# Patient Record
Sex: Male | Born: 1995 | Race: Black or African American | Hispanic: No | Marital: Single | Smoking: Never smoker
Health system: Southern US, Community
[De-identification: ages and names within clinical notes are randomized; demographics above are authoritative.]

## PROBLEM LIST (undated history)

## (undated) SURGERY — Surgical Case
Anesthesia: *Unknown

---

## 2019-04-03 ENCOUNTER — Other Ambulatory Visit: Payer: Self-pay

## 2019-04-03 DIAGNOSIS — Z20822 Contact with and (suspected) exposure to covid-19: Secondary | ICD-10-CM

## 2019-04-03 NOTE — Addendum Note (Signed)
Addended by: Luisa Dago A on: 04/03/2019 12:40 PM   Modules accepted: Orders

## 2019-04-04 LAB — NOVEL CORONAVIRUS, NAA: SARS-CoV-2, NAA: NOT DETECTED

## 2019-04-16 NOTE — Addendum Note (Signed)
Addended by: Sentoria Brent M on: 04/16/2019 12:35 PM   Modules accepted: Orders  

## 2019-06-17 ENCOUNTER — Other Ambulatory Visit: Payer: Self-pay | Admitting: *Deleted

## 2019-06-17 DIAGNOSIS — U071 COVID-19: Secondary | ICD-10-CM

## 2019-06-23 ENCOUNTER — Other Ambulatory Visit: Payer: Self-pay

## 2019-06-23 ENCOUNTER — Other Ambulatory Visit (HOSPITAL_COMMUNITY): Payer: Self-pay

## 2019-06-30 ENCOUNTER — Other Ambulatory Visit: Payer: Self-pay | Admitting: Orthopedic Surgery

## 2019-06-30 DIAGNOSIS — S42144A Nondisplaced fracture of glenoid cavity of scapula, right shoulder, initial encounter for closed fracture: Secondary | ICD-10-CM

## 2019-06-30 DIAGNOSIS — M25511 Pain in right shoulder: Secondary | ICD-10-CM

## 2019-07-01 ENCOUNTER — Ambulatory Visit (HOSPITAL_COMMUNITY): Payer: BC Managed Care – PPO | Attending: Cardiology

## 2019-07-01 ENCOUNTER — Other Ambulatory Visit: Payer: Self-pay

## 2019-07-01 ENCOUNTER — Other Ambulatory Visit: Payer: BC Managed Care – PPO | Admitting: *Deleted

## 2019-07-01 DIAGNOSIS — U071 COVID-19: Secondary | ICD-10-CM

## 2019-07-01 LAB — TROPONIN I (HIGH SENSITIVITY): Troponin I (High Sensitivity): 3 ng/L (ref <18)

## 2019-07-12 ENCOUNTER — Ambulatory Visit
Admission: RE | Admit: 2019-07-12 | Discharge: 2019-07-12 | Disposition: A | Discharge status: Home / Self Care | Payer: BC Managed Care – PPO | Source: Ambulatory Visit | Attending: Orthopedic Surgery | Admitting: Orthopedic Surgery

## 2019-07-12 DIAGNOSIS — M25511 Pain in right shoulder: Secondary | ICD-10-CM

## 2019-07-12 DIAGNOSIS — S42144A Nondisplaced fracture of glenoid cavity of scapula, right shoulder, initial encounter for closed fracture: Secondary | ICD-10-CM

## 2020-01-25 ENCOUNTER — Ambulatory Visit: Payer: No Typology Code available for payment source | Attending: Family

## 2020-01-25 DIAGNOSIS — Z23 Encounter for immunization: Secondary | ICD-10-CM

## 2020-01-25 NOTE — Progress Notes (Signed)
   Covid-19 Vaccination Clinic  Name:  Charles Zavala    MRN: [id number] DOB: [birth year]  01/25/2020  [name] was observed post Covid-19 immunization for 15 minutes without incident. He was provided with Vaccine Information Sheet and instruction to access the V-Safe system.   [name] was instructed to call 911 with any severe reactions post vaccine: Marland Kitchen Difficulty breathing  . Swelling of face and throat  . A fast heartbeat  . A bad rash all over body  . Dizziness and weakness   Immunizations Administered    Name Date Dose VIS Date Route   Moderna COVID-19 Vaccine 01/25/2020 12:05 PM 0.5 mL 07/2019 Intramuscular   Manufacturer: Gala Murdoch   Lot: [id number]   NDC: 09295-747-34

## 2020-02-22 ENCOUNTER — Ambulatory Visit: Payer: No Typology Code available for payment source | Attending: Family

## 2020-02-22 DIAGNOSIS — Z23 Encounter for immunization: Secondary | ICD-10-CM

## 2020-02-22 NOTE — Progress Notes (Signed)
   Covid-19 Vaccination Clinic  Name:  Charles Zavala    MRN: [id number] DOB: October 04, 1995  02/22/2020  Charles Zavala was observed post Covid-19 immunization for 15 minutes without incident. He was provided with Vaccine Information Sheet and instruction to access the V-Safe system.   Charles Zavala was instructed to call 911 with any severe reactions post vaccine: Marland Kitchen Difficulty breathing  . Swelling of face and throat  . A fast heartbeat  . A bad rash all over body  . Dizziness and weakness   Immunizations Administered    Name Date Dose VIS Date Route   Moderna COVID-19 Vaccine 02/22/2020 12:58 PM 0.5 mL 07/2019 Intramuscular   Manufacturer: Moderna   Lot: [id number]   NDC: 95188-416-60

## 2020-11-12 IMAGING — CT CT SHOULDER*R* W/O CM
3 series · 12 of 35 positions shown, 14 images · non-contrast
Comparison: Radiographs 06/04/2019. MR arthrogram 06/16/2019.

CLINICAL DATA: Persistent right shoulder pain. History of multiple
dislocations. Shoulder surgery in 1431.

EXAM:
CT OF THE UPPER RIGHT EXTREMITY WITHOUT CONTRAST
TECHNIQUE: Multidetector CT imaging of the right shoulder was performed
according to the standard protocol.

[Series 4: shoulder 2.00 br40 s3 axial soft · axial · 0.48mm/px · z∈[-975,-825]mm · 4 of 109 slices shown, 5 images]
[im 17/109  soft-tissue]
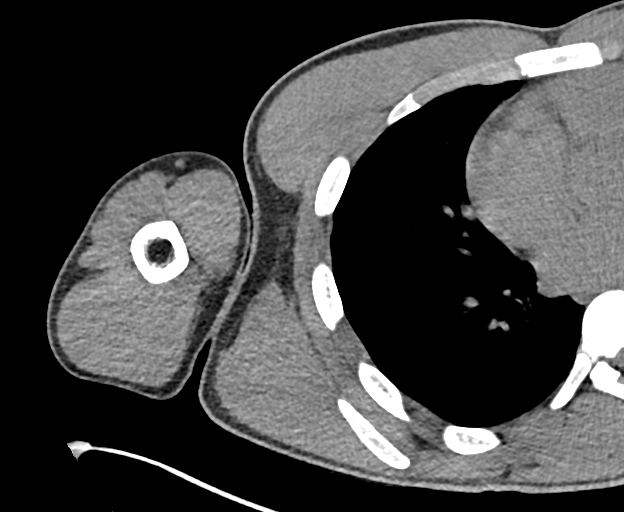
[im 17/109  bone]
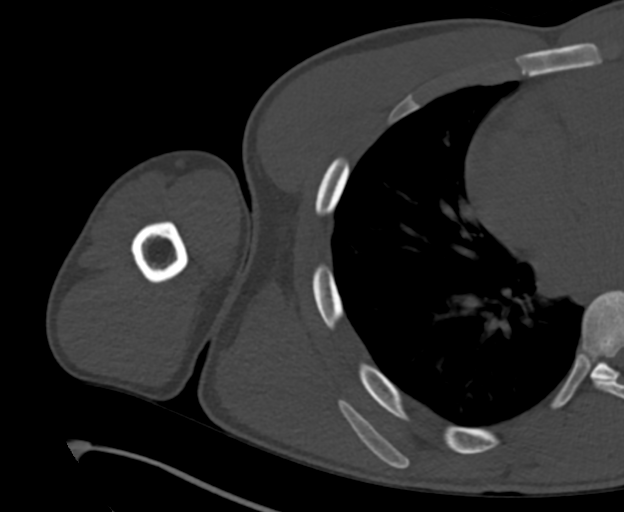
[im 42/109  bone]
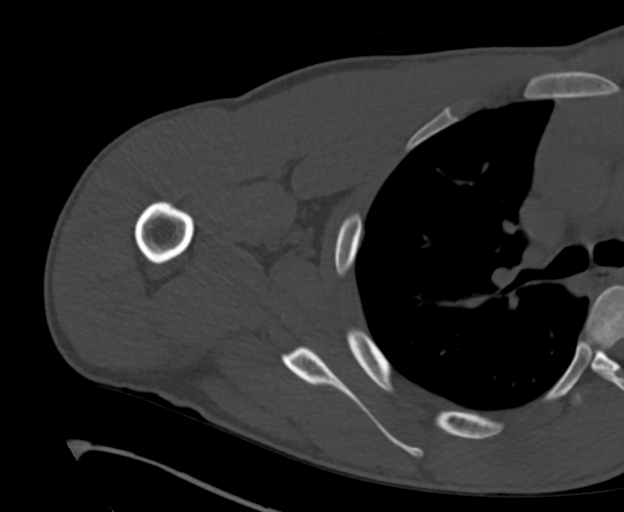
[im 67/109  bone]
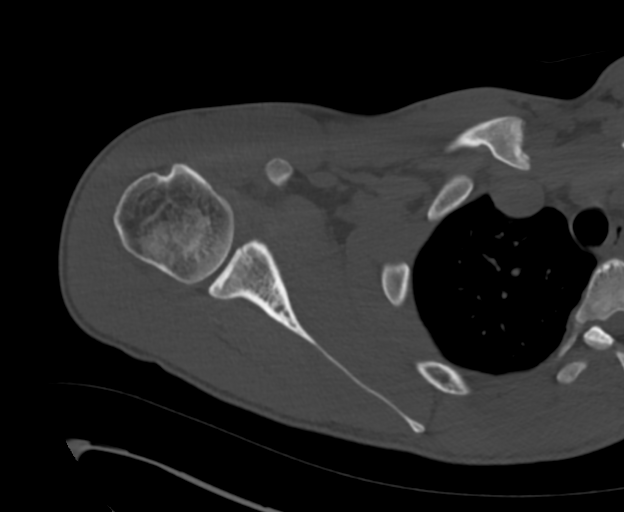
[im 92/109  bone]
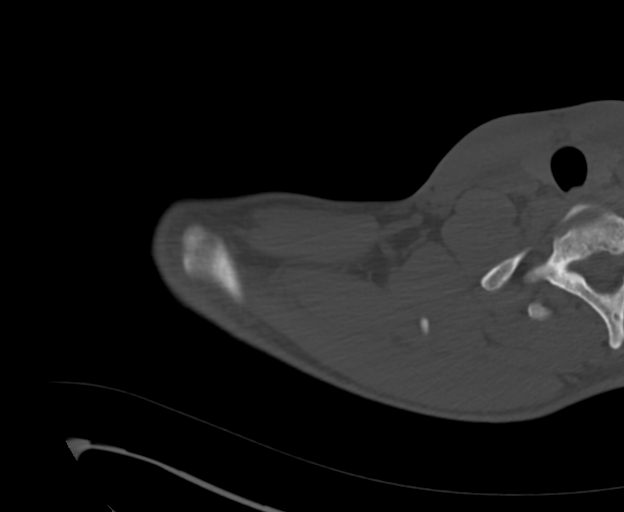

[Series 8: shoulder 2.00 br40 s3 cor soft · coronal · 0.43mm/px · 3 of 125 slices shown]
[im 25/125  bone]
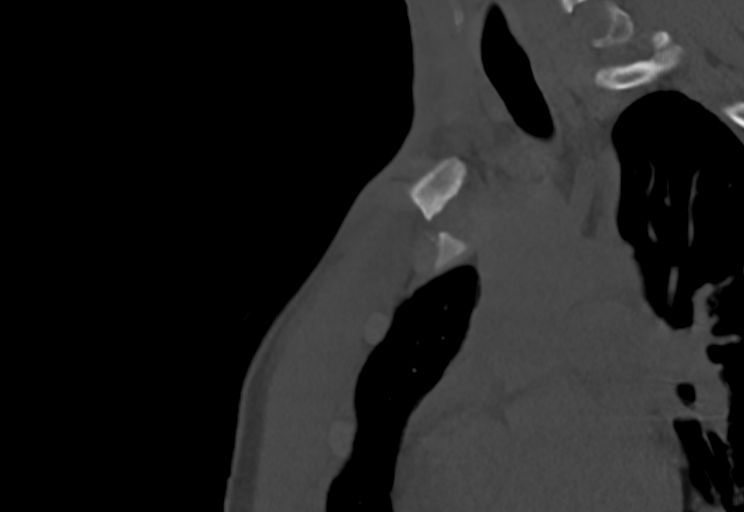
[im 50/125  bone]
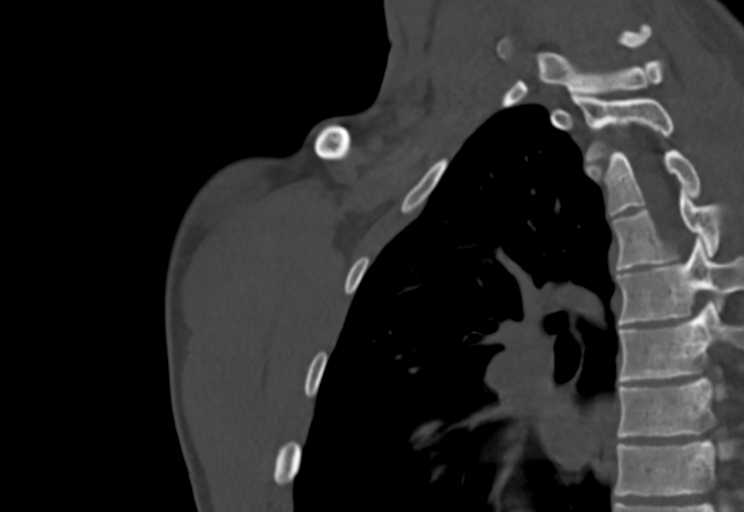
[im 75/125  bone]
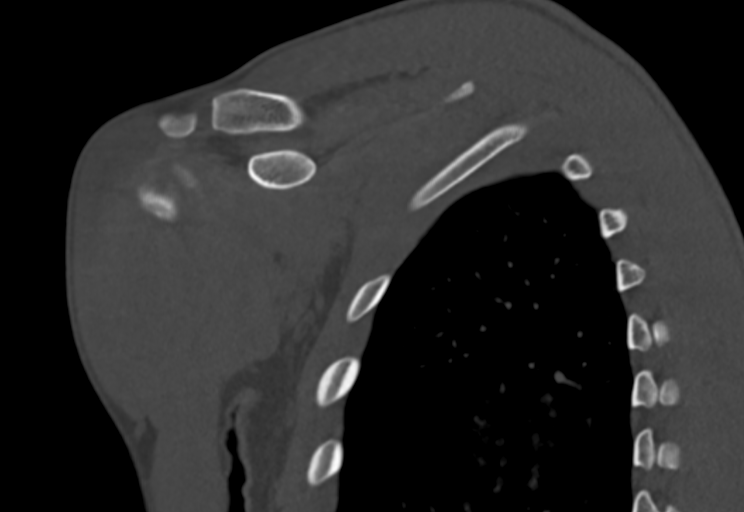

[Series 12: shoulder 2.00 br40 s3 sag soft · sagittal · 0.43mm/px · 5 of 143 slices shown, 6 images]
[im 48/143  bone]
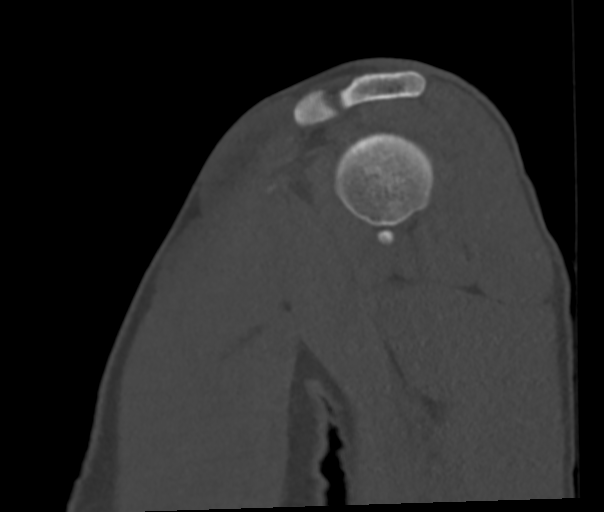
[im 60/143  bone]
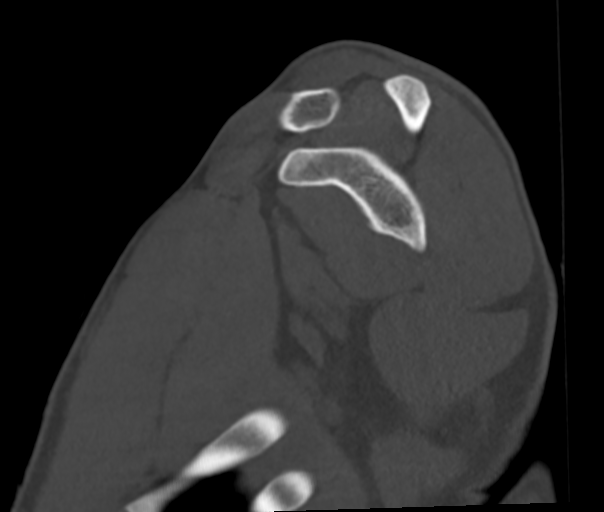
[im 72/143  soft-tissue]
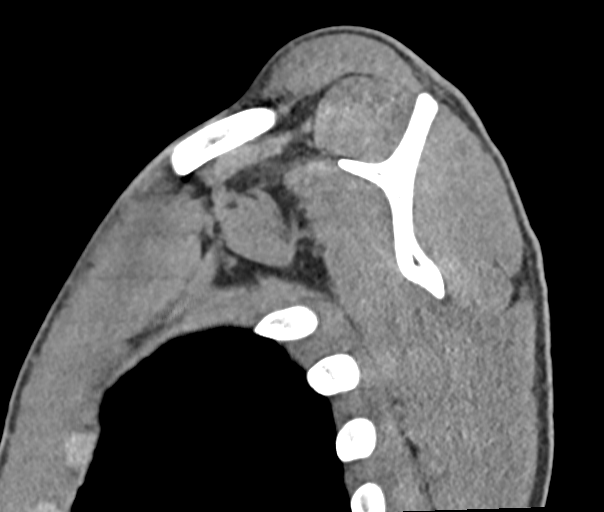
[im 72/143  bone]
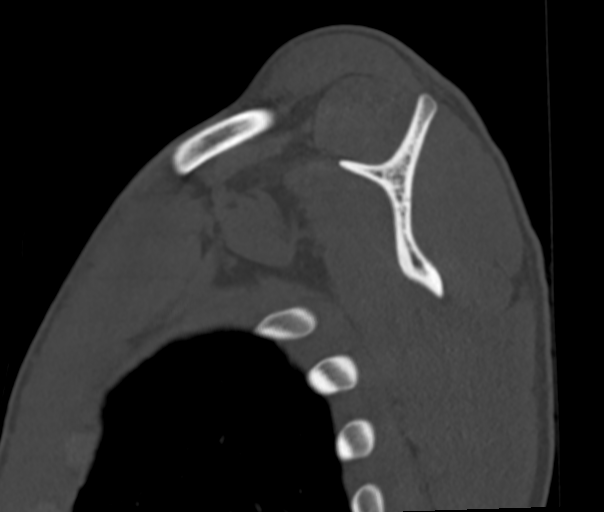
[im 83/143  bone]
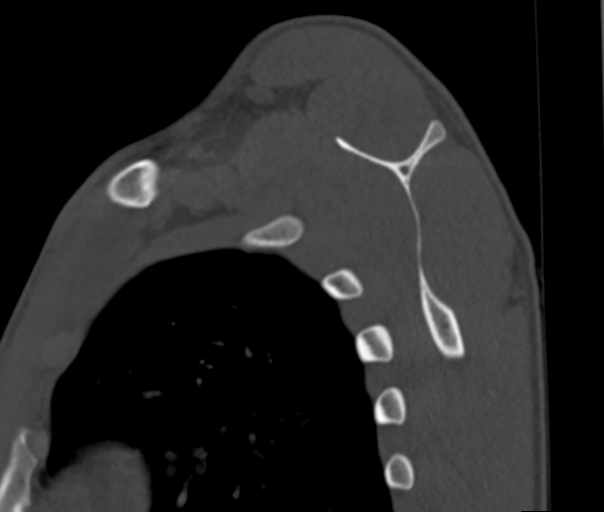
[im 95/143  bone]
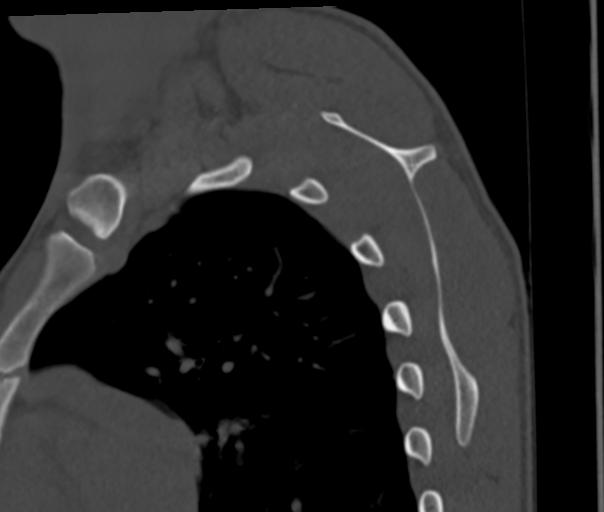

[12 of 35 positions shown; findings below may reference images not displayed]

FINDINGS: Bones/Joint/Cartilage

The humeral head is located. There is a chronic Hill-Sachs deformity
of the humeral head. Postsurgical changes are present within the
anterior glenoid consistent with previous labral repair. There is an
osseous Bankart lesion with an unfused linear bone fragment adjacent
to the anterior glenoid, measuring up to 15 mm on sagittal image
56/14. The anterior inferior glenoid rim is deficient.

No significant glenohumeral arthropathy. There is a small joint
effusion with a 6 mm intra-articular loose body in the axillary
recess as seen on MRI. The acromioclavicular joint appears normal.
No acute osseous findings.

Ligaments

Suboptimally assessed by CT.

Muscles and Tendons

No focal muscular atrophy.

Soft tissues

Unremarkable.
IMPRESSION: 1. Chronic Hill-Sachs deformity of the humeral head.
2. Chronic osseous Bankart lesion with unfused linear bone fragment
adjacent to the anterior glenoid. Deficient anterior inferior
glenoid rim.
3. Small joint effusion with intra-articular loose body in the
axillary recess.
4. No acute osseous findings.

## 2021-02-03 ENCOUNTER — Ambulatory Visit (HOSPITAL_COMMUNITY): Payer: BC Managed Care – PPO

## 2021-02-03 ENCOUNTER — Ambulatory Visit (HOSPITAL_COMMUNITY)
Admission: EM | Admit: 2021-02-03 | Discharge: 2021-02-03 | Disposition: A | Discharge status: Home / Self Care | Payer: BC Managed Care – PPO | Attending: Internal Medicine | Admitting: Internal Medicine

## 2021-02-03 ENCOUNTER — Ambulatory Visit (INDEPENDENT_AMBULATORY_CARE_PROVIDER_SITE_OTHER): Payer: BC Managed Care – PPO

## 2021-02-03 ENCOUNTER — Encounter (HOSPITAL_COMMUNITY): Payer: Self-pay

## 2021-02-03 DIAGNOSIS — S43015A Anterior dislocation of left humerus, initial encounter: Secondary | ICD-10-CM | POA: Diagnosis not present

## 2021-02-03 MED ORDER — TRAMADOL HCL 50 MG PO TABS: 50.0000 mg | ORAL_TABLET | Freq: Four times a day (QID) | ORAL | 0 refills | Status: AC | PRN

## 2021-02-03 MED ORDER — HYDROMORPHONE HCL 1 MG/ML IJ SOLN
2.0000 mg | Freq: Once | INTRAMUSCULAR | Status: AC
  Administered 2021-02-03: 2 mg via INTRAMUSCULAR

## 2021-02-03 MED ORDER — HYDROMORPHONE HCL 1 MG/ML IJ SOLN
INTRAMUSCULAR | Status: AC
  Filled 2021-02-03: qty 2

## 2021-02-03 NOTE — Discharge Instructions (Addendum)
-  Schedule a follow-up appointment with orthopedist, information below.  Call them to schedule this. -Keep your sling on is much as possible for the next few days, until your follow-up appointment. It's okay to remove this for bathing as long as you are able to keep that shoulder from moving. -Seek additional medical attention if you develop new symptoms like numbness in the arm, weakness in the hand or the elbow. -Starting tomorrow- Tramadol for pain, be careful because this can cause drowsiness.  Avoid taking before driving or operating machinery.  You can take ibuprofen and Tylenol for additional relief, take ibuprofen with food.

## 2021-02-03 NOTE — ED Triage Notes (Signed)
Pt in with c/o possible left shoulder dislocation that occurred today when he fell and landed on his arm  Denies any numbness/tingling in extremity

## 2021-02-03 NOTE — ED Provider Notes (Signed)
  [facility]   [id number] 02/03/21 Arrival Time: 1544  Procedure Note:  1. Anterior dislocation of left shoulder, initial encounter    Pt placed supine on exam table with LUE extending to floor. With gentle traction and scapular manipulation shoulder dislocation reduced. Normal ROM following. Normal radial pulse and distal sensation. Tolerated procedure well. No complications.  Post-reduction films show: Successful shoulder reduction with appropriate positioning of the glenohumeral joint. Small Hill-Sachs impaction deformity.    Mardella Layman, MD 02/05/21 [phone]

## 2021-02-03 NOTE — ED Provider Notes (Signed)
[facility]    CSN: [id number] Arrival date & time: 02/03/21  1544      History   Chief Complaint Chief Complaint  Patient presents with   Shoulder Injury    HPI Charles Zavala is a 25 y.o. male presenting with L shoulder pain following dislocation that occurred 3 hours ago.  History of 2 right shoulder dislocations with subsequent surgery, but no issues with the left shoulder in the past states he fell and caught himself on his outstretched L hand.  Denies sensation changes, numbness/tingling.  HPI  History reviewed. No pertinent past medical history.  There are no problems to display for this patient.   History reviewed. No pertinent surgical history.     Home Medications    Prior to Admission medications   Medication Sig Start Date End Date Taking? Authorizing Provider  traMADol (ULTRAM) 50 MG tablet Take 1 tablet (50 mg total) by mouth every 6 (six) hours as needed. 02/03/21  Yes Rhys Martini, PA-C    Family History History reviewed. No pertinent family history.  Social History Social History   Tobacco Use   Smoking status: Never   Smokeless tobacco: Never  Substance Use Topics   Alcohol use: Yes   Drug use: Never     Allergies   Patient has no known allergies.   Review of Systems Review of Systems  Musculoskeletal:        L shoulder pain  All other systems reviewed and are negative.   Physical Exam Triage Vital Signs ED Triage Vitals [02/03/21 1608]  Enc Vitals Group     BP (!) 112/95     Pulse Rate 68     Resp (!) 21     Temp 97.8 F (36.6 C)     Temp Source Oral     SpO2 99 %     Weight      Height      Head Circumference      Peak Flow      Pain Score 10     Pain Loc      Pain Edu?      Excl. in GC?    No data found.  Updated Vital Signs BP (!) 112/95 (BP Location: Right Arm)   Pulse 68   Temp 97.8 F (36.6 C) (Oral)   Resp (!) 21   SpO2 99%   Visual Acuity Right Eye Distance:   Left Eye Distance:    Bilateral Distance:    Right Eye Near:   Left Eye Near:    Bilateral Near:     Physical Exam Vitals reviewed.  Constitutional:      General: He is not in acute distress.    Appearance: Normal appearance. He is not ill-appearing or diaphoretic.  HENT:     Head: Normocephalic and atraumatic.  Cardiovascular:     Rate and Rhythm: Normal rate and regular rhythm.     Heart sounds: Normal heart sounds.  Pulmonary:     Effort: Pulmonary effort is normal.     Breath sounds: Normal breath sounds.  Musculoskeletal:     Comments: L shoulder- visibly anteriorly displaced. ROM and exam limited due to pain. No bony deformity. Sensation intact. Radial pulse 2+, cap refill <2 seconds, grip strength 5/5. No snuffbox tenderness.  Following reduction, sensation still intact, radial pulse 2+, cap refill less than 2 seconds.  Skin:    General: Skin is warm.  Neurological:     General: No focal deficit  present.     Mental Status: He is alert and oriented to person, place, and time.  Psychiatric:        Mood and Affect: Mood normal.        Behavior: Behavior normal.        Thought Content: Thought content normal.        Judgment: Judgment normal.     UC Treatments / Results  Labs (all labs ordered are listed, but only abnormal results are displayed) Labs Reviewed - No data to display  EKG   Radiology DG Shoulder Left  Result Date: 02/03/2021 CLINICAL DATA:  Recent injury with dislocation, initial encounter EXAM: LEFT SHOULDER - 2+ VIEW COMPARISON:  06/04/2019 FINDINGS: Anterior inferior dislocation of the humeral head is noted with respect to the glenoid. No definitive fracture is seen. IMPRESSION: Anterior inferior dislocation of the humeral head. Electronically Signed   By: Alcide Clever M.D.   On: 02/03/2021 16:34    Procedures Procedures (including critical care time)  Medications Ordered in UC Medications  HYDROmorphone (DILAUDID) injection 2 mg (2 mg Intramuscular Given 02/03/21  1658)    Initial Impression / Assessment and Plan / UC Course  I have reviewed the triage vital signs and the nursing notes.  Pertinent labs & imaging results that were available during my care of the patient were reviewed by me and considered in my medical decision making (see chart for details).    This patient is a 25 year old male presenting with left shoulder anterior dislocation that occurred 3 hours ago due to fall.  Neurovascularly intact.   Xray L shoulder- Anterior inferior dislocation of the humeral head.  Attending physician Dr. Tracie Harrier successfully performed reduction. Dilaudid for pain control. Patient neurovascularly intact following reduction.  Repeat Xray L shoulder- Successful shoulder reduction with appropriate positioning of the glenohumeral joint. Small Hill-Sachs impaction deformity.  Tramadol sent for pain control, follow-up with Ortho at their earliest convenience.  Keep shoulder immobilized with sling.   Final Clinical Impressions(s) / UC Diagnoses   Final diagnoses:  Anterior dislocation of left shoulder, initial encounter     Discharge Instructions      -Schedule a follow-up appointment with orthopedist, information below.  Call them to schedule this. -Keep your sling on is much as possible for the next few days, until your follow-up appointment. It's okay to remove this for bathing as long as you are able to keep that shoulder from moving. -Seek additional medical attention if you develop new symptoms like numbness in the arm, weakness in the hand or the elbow. -Starting tomorrow- Tramadol for pain, be careful because this can cause drowsiness.  Avoid taking before driving or operating machinery.  You can take ibuprofen and Tylenol for additional relief, take ibuprofen with food.     ED Prescriptions     Medication Sig Dispense Auth. Provider   traMADol (ULTRAM) 50 MG tablet Take 1 tablet (50 mg total) by mouth every 6 (six) hours as needed. 15  tablet Rhys Martini, PA-C      I have reviewed the PDMP during this encounter.   Rhys Martini, PA-C 02/04/21 1012

## 2022-06-07 IMAGING — DX DG SHOULDER 2+V*L*
3 series · 3 of 3 positions shown · non-contrast
Comparison: 06/04/2019

CLINICAL DATA: Recent injury with dislocation, initial encounter

EXAM:
LEFT SHOULDER - 2+ VIEW

[shoulder ap]
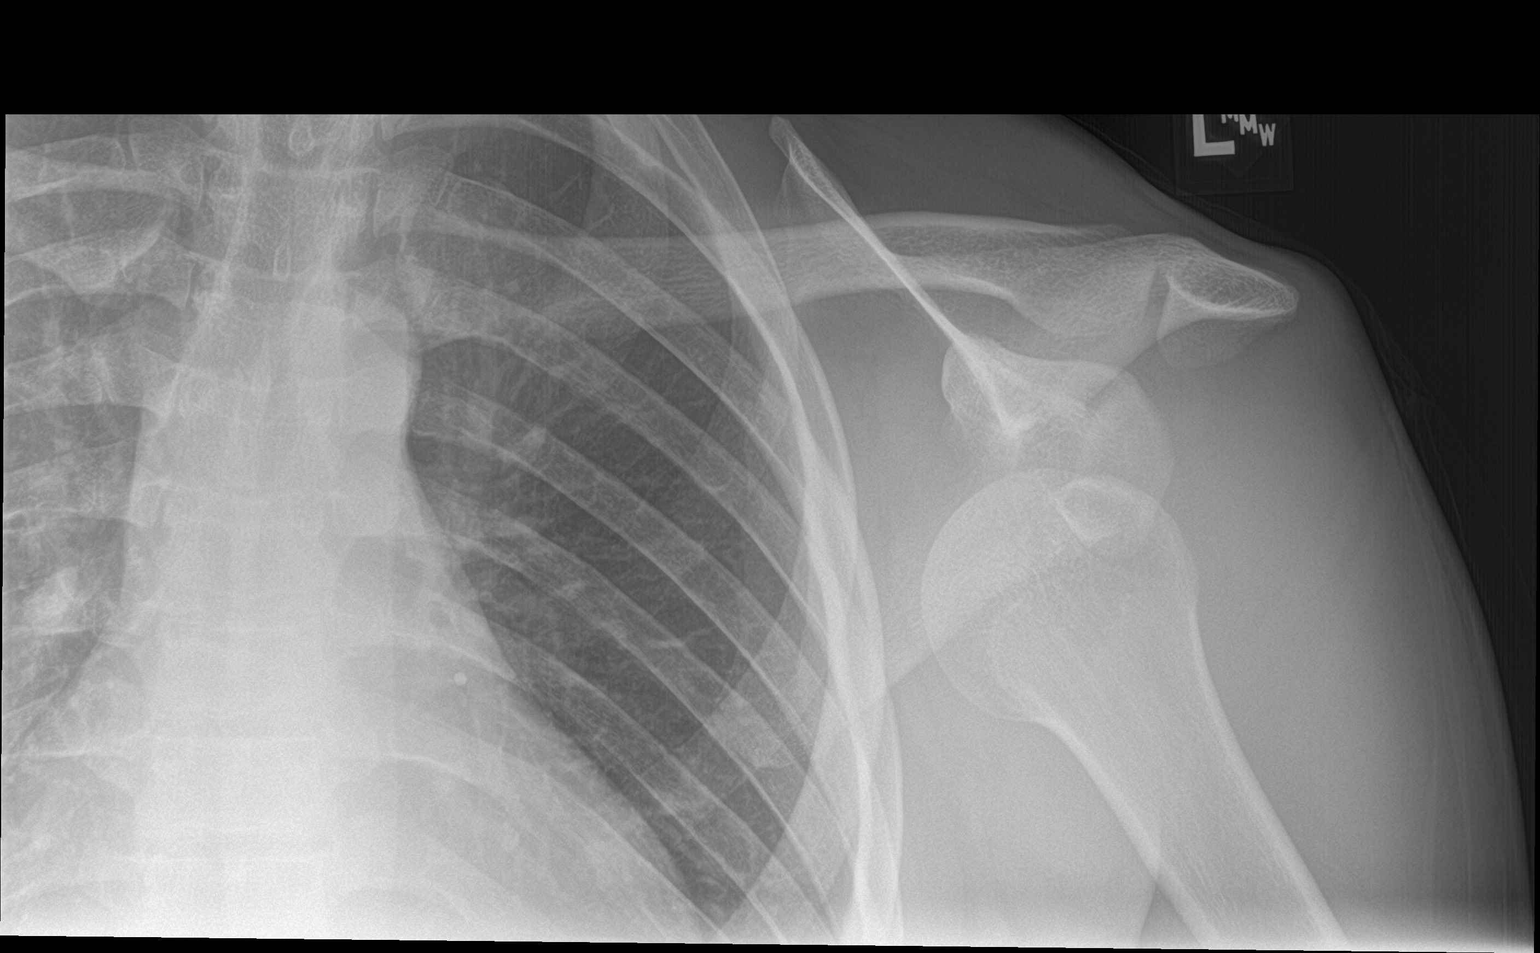

[shoulder grashey]
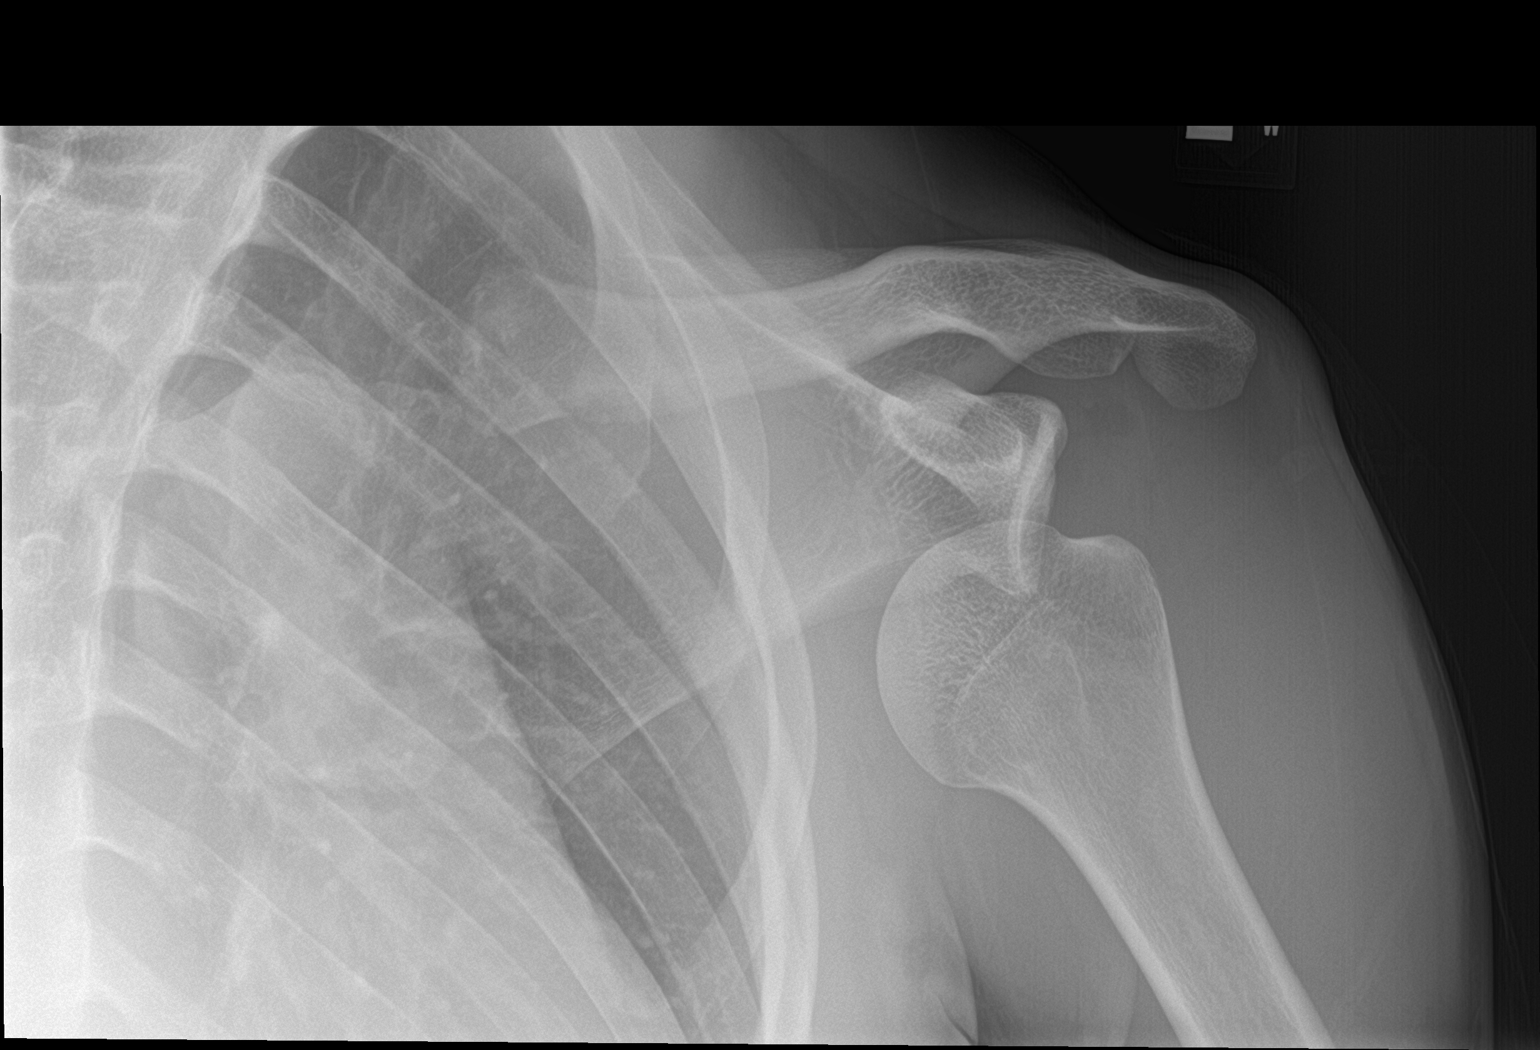

[shoulder y-view]
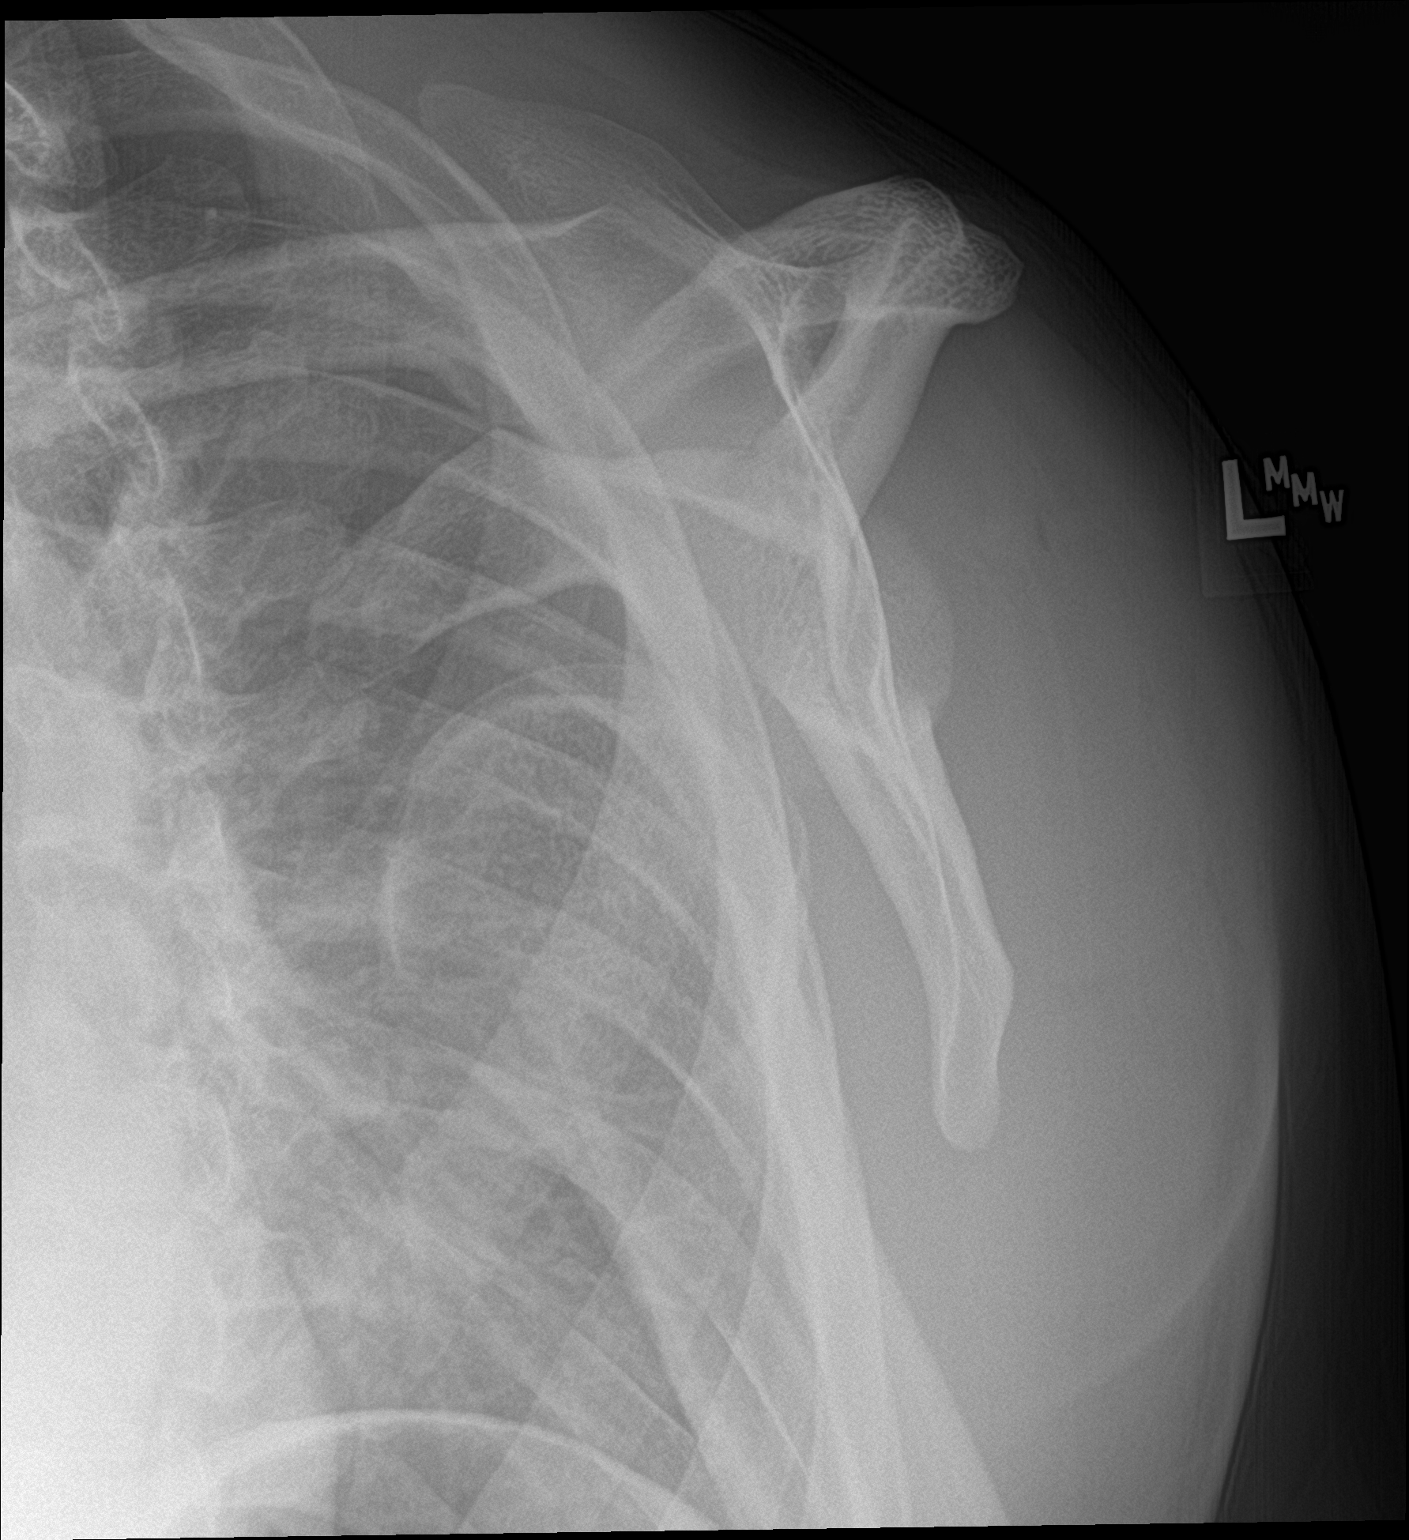

[3 of 3 positions shown; findings below may reference images not displayed]

FINDINGS: Anterior inferior dislocation of the humeral head is noted with
respect to the glenoid. No definitive fracture is seen.
IMPRESSION: Anterior inferior dislocation of the humeral head.
